# Patient Record
Sex: Female | Born: 1935 | Race: White | Hispanic: No | Marital: Married | Smoking: Never smoker
Health system: Southern US, Community
[De-identification: ages and names within clinical notes are randomized; demographics above are authoritative.]

## PROBLEM LIST (undated history)

## (undated) DIAGNOSIS — E785 Hyperlipidemia, unspecified: Secondary | ICD-10-CM

## (undated) DIAGNOSIS — I10 Essential (primary) hypertension: Secondary | ICD-10-CM

## (undated) DIAGNOSIS — I351 Nonrheumatic aortic (valve) insufficiency: Secondary | ICD-10-CM

## (undated) DIAGNOSIS — M8580 Other specified disorders of bone density and structure, unspecified site: Secondary | ICD-10-CM

## (undated) DIAGNOSIS — M170 Bilateral primary osteoarthritis of knee: Secondary | ICD-10-CM

## (undated) HISTORY — DX: Bilateral primary osteoarthritis of knee: M17.0

## (undated) HISTORY — PX: VARICOSE VEIN SURGERY: SHX832

## (undated) HISTORY — DX: Hyperlipidemia, unspecified: E78.5

## (undated) HISTORY — PX: REPAIR RECTOCELE: SUR1206

## (undated) HISTORY — PX: TONSILLECTOMY: SUR1361

## (undated) HISTORY — DX: Other specified disorders of bone density and structure, unspecified site: M85.80

## (undated) HISTORY — DX: Nonrheumatic aortic (valve) insufficiency: I35.1

## (undated) HISTORY — PX: THYROID SURGERY: SHX805

## (undated) HISTORY — PX: APPENDECTOMY: SHX54

## (undated) HISTORY — PX: SALPINGOOPHORECTOMY: SHX82

## (undated) HISTORY — DX: Essential (primary) hypertension: I10

## (undated) HISTORY — PX: ABDOMINAL HYSTERECTOMY: SHX81

---

## 2012-10-20 DIAGNOSIS — I351 Nonrheumatic aortic (valve) insufficiency: Secondary | ICD-10-CM | POA: Insufficient documentation

## 2013-12-12 ENCOUNTER — Emergency Department: Payer: Self-pay | Admitting: Emergency Medicine

## 2015-11-16 ENCOUNTER — Other Ambulatory Visit: Payer: Self-pay

## 2015-11-16 DIAGNOSIS — I10 Essential (primary) hypertension: Secondary | ICD-10-CM | POA: Insufficient documentation

## 2015-11-16 DIAGNOSIS — M8580 Other specified disorders of bone density and structure, unspecified site: Secondary | ICD-10-CM | POA: Insufficient documentation

## 2015-11-16 DIAGNOSIS — M170 Bilateral primary osteoarthritis of knee: Secondary | ICD-10-CM | POA: Insufficient documentation

## 2015-11-16 DIAGNOSIS — E785 Hyperlipidemia, unspecified: Secondary | ICD-10-CM | POA: Insufficient documentation

## 2015-11-16 DIAGNOSIS — E039 Hypothyroidism, unspecified: Secondary | ICD-10-CM | POA: Insufficient documentation

## 2015-11-17 ENCOUNTER — Encounter: Payer: Self-pay | Admitting: Internal Medicine

## 2015-11-17 ENCOUNTER — Other Ambulatory Visit: Payer: Self-pay

## 2015-11-17 ENCOUNTER — Encounter: Payer: Self-pay | Admitting: Gastroenterology

## 2015-11-17 ENCOUNTER — Ambulatory Visit (INDEPENDENT_AMBULATORY_CARE_PROVIDER_SITE_OTHER): Payer: Medicare PPO | Admitting: Gastroenterology

## 2015-11-17 VITALS — BP 157/74 | HR 79 | Temp 98.3°F | Ht 65.0 in | Wt 109.0 lb

## 2015-11-17 DIAGNOSIS — K219 Gastro-esophageal reflux disease without esophagitis: Secondary | ICD-10-CM | POA: Diagnosis not present

## 2015-11-17 MED ORDER — RANITIDINE HCL 150 MG PO TABS: 150.0000 mg | ORAL_TABLET | Freq: Two times a day (BID) | ORAL | 1 refills | Status: DC

## 2015-11-17 MED ORDER — RANITIDINE HCL 150 MG PO TABS: 150.0000 mg | ORAL_TABLET | Freq: Two times a day (BID) | ORAL | Status: AC

## 2015-11-17 NOTE — Progress Notes (Signed)
Gastroenterology Consultation  Referring Provider:     Janace Litten., MD Primary Care Physician:  Janace Litten., MD Primary Gastroenterologist:  Dr. Jonathon Bellows  Reason for Consultation:     GERD        HPI:   Gina Baker is a 82 y.o. y/o female referred for consultation & management  by Dr. Janace Litten., MD.    Reflux:  Onset : for many years , last 6 -12 worse , 2 weeks back was the worst Symptoms: Describes her symptoms of heartburn Presently still has some chest discomfort , whenever she eats or drinks it hurts.  PPI /H2 blockers or Antacid  use and timing :zantac once a day , feels better with zantac. Certain foods make it worse  Dinner time :  Usually worse symptoms after dinner between 5-6 pm  Prior EGD: never  Family history of esophageal cancer:no     No past medical history on file.  Past Surgical History:  Procedure Laterality Date  . ABDOMINAL HYSTERECTOMY    . APPENDECTOMY    . TONSILLECTOMY      Prior to Admission medications   Medication Sig Start Date End Date Taking? Authorizing Provider  estradiol (ESTRACE) 0.5 MG tablet TAKE 1 TABLET ONE TIME DAILY 11/25/14  Yes Historical Provider, MD  levothyroxine (SYNTHROID, LEVOTHROID) 112 MCG tablet TAKE 1 TABLET EVERY DAY  (NEW  DOSE) 06/28/15  Yes Historical Provider, MD  losartan (COZAAR) 100 MG tablet Take by mouth. 05/20/15 05/19/16 Yes Historical Provider, MD  psyllium (METAMUCIL SMOOTH TEXTURE) 28 % packet Take by mouth.   Yes Historical Provider, MD    No family history on file.   Social History  Substance Use Topics  . Smoking status: Never Smoker  . Smokeless tobacco: Never Used  . Alcohol use No    Allergies as of 11/17/2015 - Review Complete 11/17/2015  Allergen Reaction Noted  . Amoxicillin Other (See Comments) 06/18/2014  . Sulfa antibiotics Other (See Comments) and Diarrhea 01/22/2012    Review of Systems:    All systems reviewed and negative except where noted in  HPI.   Physical Exam:  BP (!) 157/74   Pulse 79   Temp 98.3 F (36.8 C) (Oral)   Ht 5\' 5"  (1.651 m)   Wt 109 lb (49.4 kg)   BMI 18.14 kg/m  No LMP recorded. Psych:  Alert and cooperative. Normal mood and affect. General:   Alert,  Well-developed, well-nourished, pleasant and cooperative in NAD Head:  Normocephalic and atraumatic. Eyes:  Sclera clear, no icterus.   Conjunctiva pink. Ears:  Normal auditory acuity. Nose:  No deformity, discharge, or lesions. Mouth:  No deformity or lesions,oropharynx pink & moist. Neck:  Supple; no masses or thyromegaly. Lungs:  Respirations even and unlabored.  Clear throughout to auscultation.   No wheezes, crackles, or rhonchi. No acute distress. Heart:  Regular rate and rhythm; no murmurs, clicks, rubs, or gallops. Abdomen:  Normal bowel sounds.  No bruits.  Soft, non-tender and non-distended without masses, hepatosplenomegaly or hernias noted.  No guarding or rebound tenderness.    Msk:  Symmetrical without gross deformities. Good, equal movement & strength bilaterally. Pulses:  Normal pulses noted. Extremities:  No clubbing or edema.  No cyanosis. Neurologic:  Alert and oriented x3;  grossly normal neurologically. Skin:  Intact without significant lesions or rashes. No jaundice. Lymph Nodes:  No significant cervical adenopathy. Psych:  Alert and cooperative. Normal mood and affect.  Imaging Studies: No results  found.  Assessment and Plan:   Gina Baker is a 82 y.o. y/o female has been referred for GERD. She has good relief with zantac but not complete.    1. GERD : Counseled on life style changes, . Advised on the use of a wedge pillow at night , avoid meals for 2 hours prior to bed time. Incease zantac from once a day to twice a day 150 mg . I did discuss that if she does not feel better then can consider an EGD. She is really not too keen on an EGD, We did discuss the risks vs benefits of an endoscopy   Follow up in 8-12 weeks  Dr  Jonathon Bellows MD       Follow up in 6 weeks

## 2015-11-17 NOTE — Patient Instructions (Addendum)

## 2015-11-17 NOTE — Addendum Note (Signed)
Addended by: Jonathon Bellows on: 11/17/2015 01:53 PM   Modules accepted: Orders

## 2015-11-26 IMAGING — CR DG ABDOMEN 3V
1 series · 3 of 3 positions shown · non-contrast
Comparison: None.

CLINICAL DATA: 86-year-old with constipation.

EXAM:
ABDOMEN SERIES

[Series 1: dxr abdomen 3-way (incl pa cxr) · 0.14mm/px · 3 of 3 slices shown]
[im 1/3]
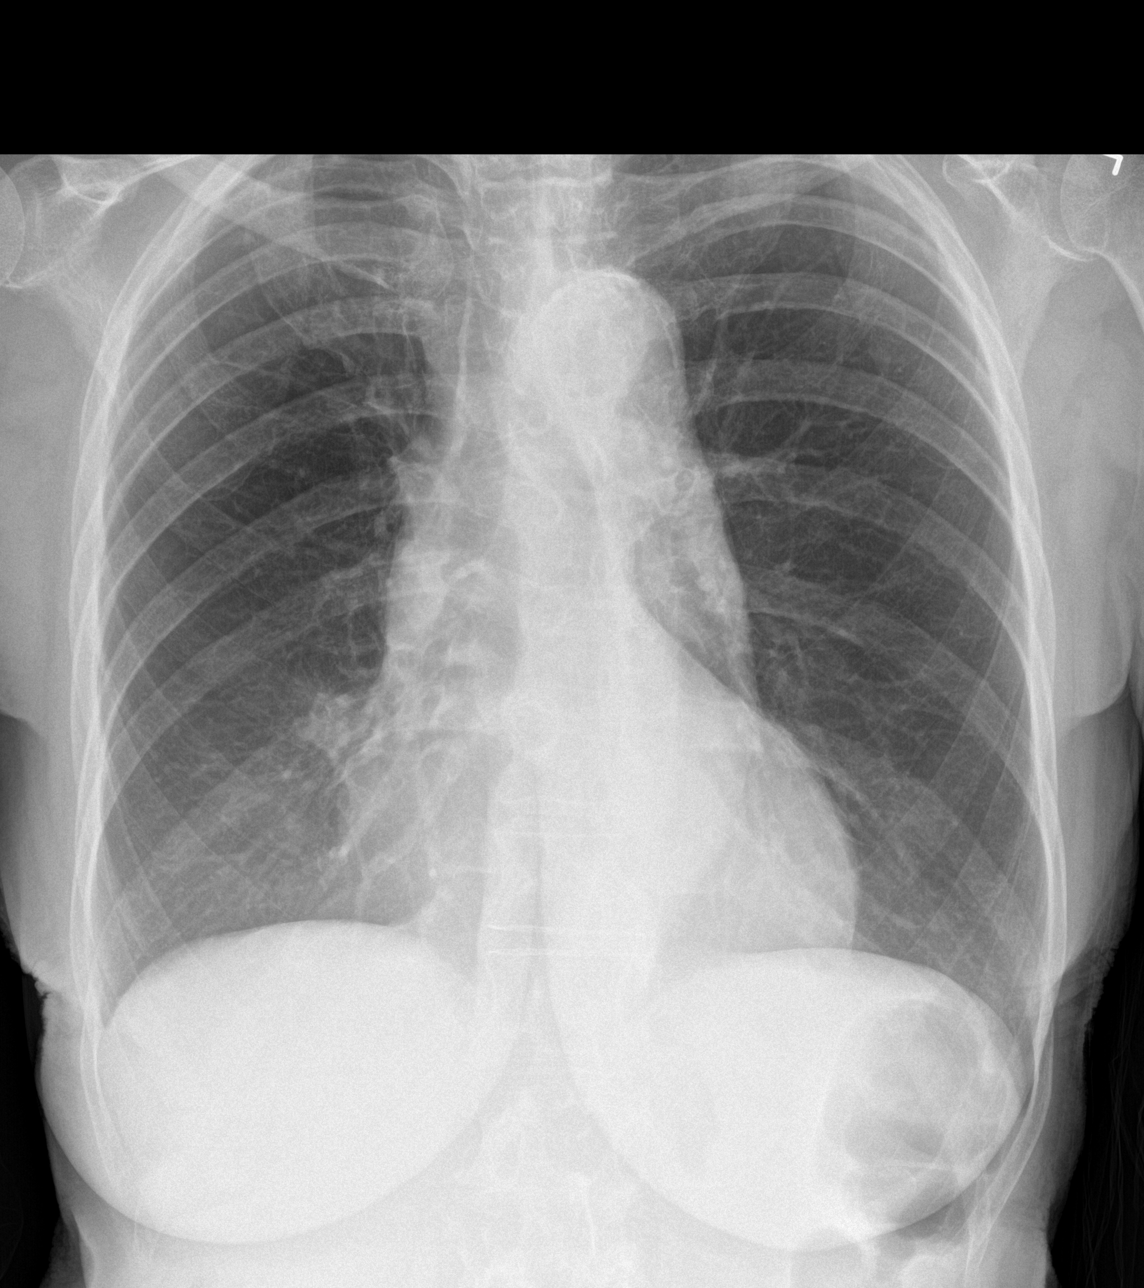
[im 2/3]
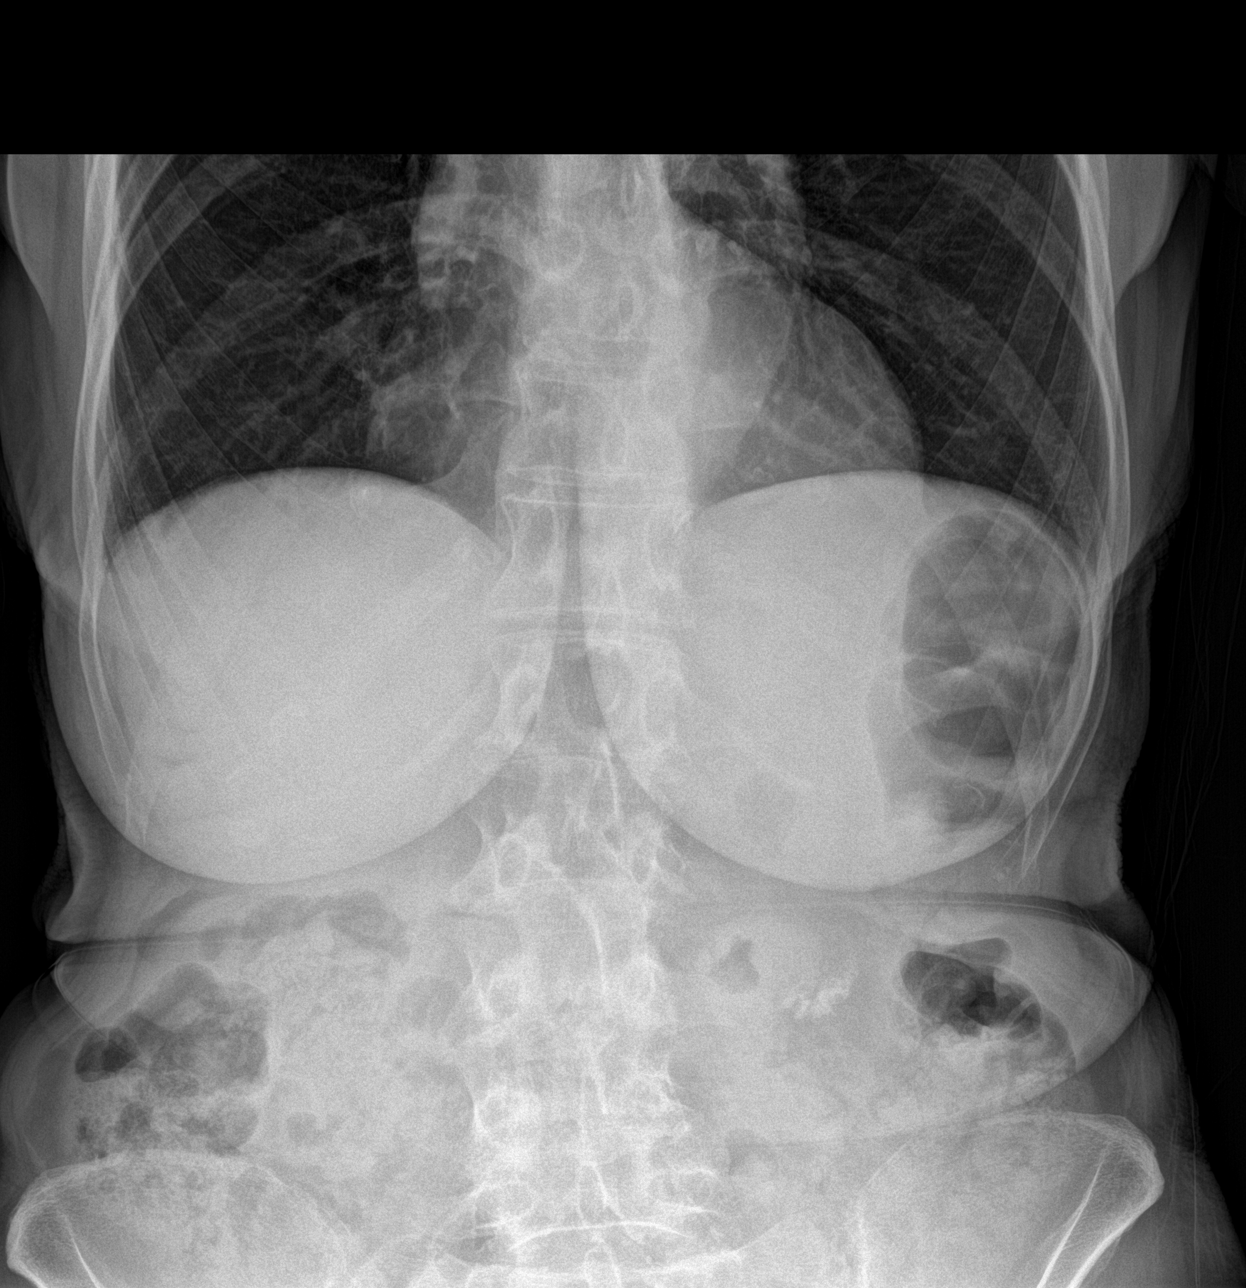
[im 3/3]
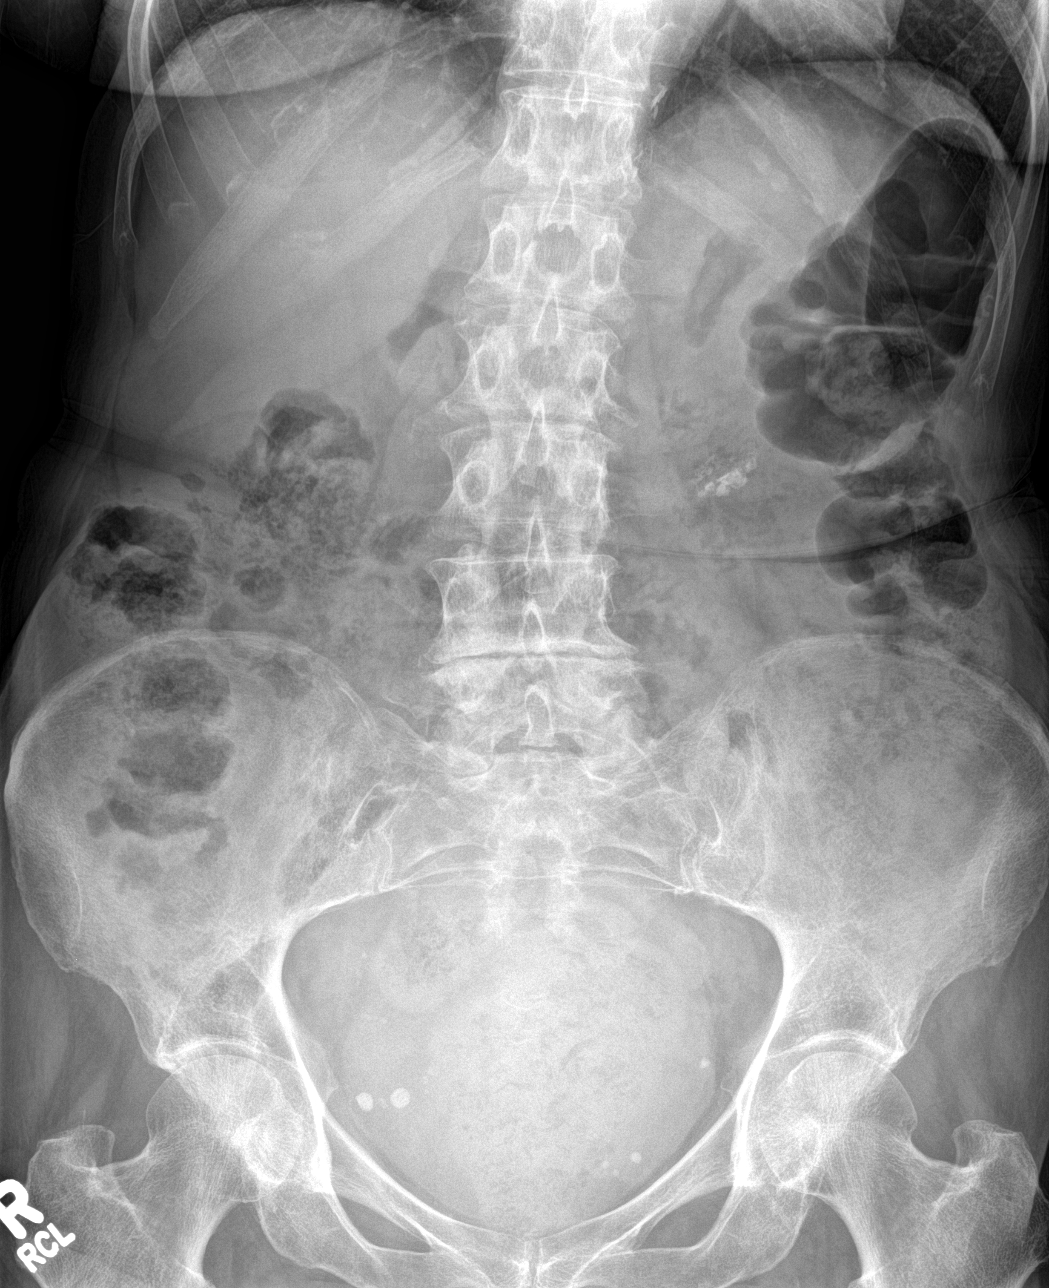

[3 of 3 positions shown; findings below may reference images not displayed]

FINDINGS: Chest radiograph demonstrates atherosclerotic calcifications in the
thoracic aorta. Heart size is normal. Possible calcifications and
scarring at the right lung apex. No focal airspace disease. No
evidence for free air. Calcifications in the expected location of
the left kidney. There is a large amount of stool throughout the
abdomen and pelvis. Large amount of stool in the rectum. Phleboliths
throughout the pelvis. Disc space narrowing with endplate changes at
L4-L5.
IMPRESSION: Large stool burden in the abdomen and pelvis. Findings are
compatible with constipation.

Calcifications or high-density material in the region of the left
kidney. Findings could represent left renal stones but this material
could also be within stool.

No acute chest findings.

## 2015-12-28 ENCOUNTER — Other Ambulatory Visit: Payer: Self-pay

## 2015-12-28 ENCOUNTER — Telehealth: Payer: Self-pay | Admitting: Gastroenterology

## 2015-12-28 MED ORDER — RANITIDINE HCL 150 MG PO TABS: 150.0000 mg | ORAL_TABLET | Freq: Two times a day (BID) | ORAL | 5 refills | Status: AC

## 2015-12-28 NOTE — Telephone Encounter (Signed)
Patient canceled her appointment on 12/20. She stated that her PCP may have her do Cologuard. She also stataed that Dr. Vicente Males was suppose to call in Zantac to [facility] in [place] but they didn't get it. Please call

## 2015-12-30 NOTE — Telephone Encounter (Signed)
Pt notified rx for Zantac has been sent to her pharmacy.

## 2016-01-05 ENCOUNTER — Ambulatory Visit: Payer: Medicare PPO | Admitting: Gastroenterology

## 2020-02-04 DIAGNOSIS — M9903 Segmental and somatic dysfunction of lumbar region: Secondary | ICD-10-CM | POA: Diagnosis not present

## 2020-02-04 DIAGNOSIS — M9901 Segmental and somatic dysfunction of cervical region: Secondary | ICD-10-CM | POA: Diagnosis not present

## 2020-02-04 DIAGNOSIS — R519 Headache, unspecified: Secondary | ICD-10-CM | POA: Diagnosis not present

## 2020-02-04 DIAGNOSIS — M4306 Spondylolysis, lumbar region: Secondary | ICD-10-CM | POA: Diagnosis not present

## 2020-03-03 DIAGNOSIS — M9903 Segmental and somatic dysfunction of lumbar region: Secondary | ICD-10-CM | POA: Diagnosis not present

## 2020-03-03 DIAGNOSIS — M9901 Segmental and somatic dysfunction of cervical region: Secondary | ICD-10-CM | POA: Diagnosis not present

## 2020-03-03 DIAGNOSIS — M4306 Spondylolysis, lumbar region: Secondary | ICD-10-CM | POA: Diagnosis not present

## 2020-03-03 DIAGNOSIS — R519 Headache, unspecified: Secondary | ICD-10-CM | POA: Diagnosis not present

## 2020-03-29 DIAGNOSIS — H40003 Preglaucoma, unspecified, bilateral: Secondary | ICD-10-CM | POA: Diagnosis not present

## 2020-03-29 DIAGNOSIS — H31012 Macula scars of posterior pole (postinflammatory) (post-traumatic), left eye: Secondary | ICD-10-CM | POA: Diagnosis not present

## 2020-03-31 DIAGNOSIS — M9903 Segmental and somatic dysfunction of lumbar region: Secondary | ICD-10-CM | POA: Diagnosis not present

## 2020-03-31 DIAGNOSIS — M4306 Spondylolysis, lumbar region: Secondary | ICD-10-CM | POA: Diagnosis not present

## 2020-03-31 DIAGNOSIS — M9901 Segmental and somatic dysfunction of cervical region: Secondary | ICD-10-CM | POA: Diagnosis not present

## 2020-03-31 DIAGNOSIS — R519 Headache, unspecified: Secondary | ICD-10-CM | POA: Diagnosis not present

## 2020-04-06 DIAGNOSIS — I10 Essential (primary) hypertension: Secondary | ICD-10-CM | POA: Diagnosis not present

## 2020-04-06 DIAGNOSIS — I2510 Atherosclerotic heart disease of native coronary artery without angina pectoris: Secondary | ICD-10-CM | POA: Diagnosis not present

## 2020-04-06 DIAGNOSIS — E039 Hypothyroidism, unspecified: Secondary | ICD-10-CM | POA: Diagnosis not present

## 2020-04-06 DIAGNOSIS — E782 Mixed hyperlipidemia: Secondary | ICD-10-CM | POA: Diagnosis not present

## 2020-04-07 DIAGNOSIS — I10 Essential (primary) hypertension: Secondary | ICD-10-CM | POA: Diagnosis not present

## 2020-04-07 DIAGNOSIS — E039 Hypothyroidism, unspecified: Secondary | ICD-10-CM | POA: Diagnosis not present

## 2020-04-07 DIAGNOSIS — K5900 Constipation, unspecified: Secondary | ICD-10-CM | POA: Diagnosis not present

## 2020-04-07 DIAGNOSIS — J309 Allergic rhinitis, unspecified: Secondary | ICD-10-CM | POA: Diagnosis not present

## 2020-04-07 DIAGNOSIS — I2510 Atherosclerotic heart disease of native coronary artery without angina pectoris: Secondary | ICD-10-CM | POA: Diagnosis not present

## 2020-04-07 DIAGNOSIS — Z008 Encounter for other general examination: Secondary | ICD-10-CM | POA: Diagnosis not present

## 2020-04-07 DIAGNOSIS — H269 Unspecified cataract: Secondary | ICD-10-CM | POA: Diagnosis not present

## 2020-04-07 DIAGNOSIS — R32 Unspecified urinary incontinence: Secondary | ICD-10-CM | POA: Diagnosis not present

## 2020-04-07 DIAGNOSIS — M1990 Unspecified osteoarthritis, unspecified site: Secondary | ICD-10-CM | POA: Diagnosis not present

## 2020-04-07 DIAGNOSIS — G8929 Other chronic pain: Secondary | ICD-10-CM | POA: Diagnosis not present

## 2020-04-07 DIAGNOSIS — K219 Gastro-esophageal reflux disease without esophagitis: Secondary | ICD-10-CM | POA: Diagnosis not present

## 2020-04-07 DIAGNOSIS — E785 Hyperlipidemia, unspecified: Secondary | ICD-10-CM | POA: Diagnosis not present

## 2020-04-07 DIAGNOSIS — Z7982 Long term (current) use of aspirin: Secondary | ICD-10-CM | POA: Diagnosis not present

## 2020-04-12 DIAGNOSIS — I10 Essential (primary) hypertension: Secondary | ICD-10-CM | POA: Diagnosis not present

## 2020-04-12 DIAGNOSIS — E039 Hypothyroidism, unspecified: Secondary | ICD-10-CM | POA: Diagnosis not present

## 2020-05-11 DIAGNOSIS — R519 Headache, unspecified: Secondary | ICD-10-CM | POA: Diagnosis not present

## 2020-05-11 DIAGNOSIS — M9901 Segmental and somatic dysfunction of cervical region: Secondary | ICD-10-CM | POA: Diagnosis not present

## 2020-05-11 DIAGNOSIS — M9903 Segmental and somatic dysfunction of lumbar region: Secondary | ICD-10-CM | POA: Diagnosis not present

## 2020-05-11 DIAGNOSIS — M4306 Spondylolysis, lumbar region: Secondary | ICD-10-CM | POA: Diagnosis not present

## 2020-05-31 DIAGNOSIS — I10 Essential (primary) hypertension: Secondary | ICD-10-CM | POA: Diagnosis not present

## 2020-05-31 DIAGNOSIS — E039 Hypothyroidism, unspecified: Secondary | ICD-10-CM | POA: Diagnosis not present

## 2020-06-08 DIAGNOSIS — R519 Headache, unspecified: Secondary | ICD-10-CM | POA: Diagnosis not present

## 2020-06-08 DIAGNOSIS — M9903 Segmental and somatic dysfunction of lumbar region: Secondary | ICD-10-CM | POA: Diagnosis not present

## 2020-06-08 DIAGNOSIS — M4306 Spondylolysis, lumbar region: Secondary | ICD-10-CM | POA: Diagnosis not present

## 2020-06-08 DIAGNOSIS — M9901 Segmental and somatic dysfunction of cervical region: Secondary | ICD-10-CM | POA: Diagnosis not present

## 2020-07-06 DIAGNOSIS — M4306 Spondylolysis, lumbar region: Secondary | ICD-10-CM | POA: Diagnosis not present

## 2020-07-06 DIAGNOSIS — M9903 Segmental and somatic dysfunction of lumbar region: Secondary | ICD-10-CM | POA: Diagnosis not present

## 2020-07-06 DIAGNOSIS — M9901 Segmental and somatic dysfunction of cervical region: Secondary | ICD-10-CM | POA: Diagnosis not present

## 2020-07-06 DIAGNOSIS — R519 Headache, unspecified: Secondary | ICD-10-CM | POA: Diagnosis not present

## 2020-07-30 DIAGNOSIS — I10 Essential (primary) hypertension: Secondary | ICD-10-CM | POA: Diagnosis not present

## 2020-07-30 DIAGNOSIS — E782 Mixed hyperlipidemia: Secondary | ICD-10-CM | POA: Diagnosis not present

## 2020-07-30 DIAGNOSIS — E039 Hypothyroidism, unspecified: Secondary | ICD-10-CM | POA: Diagnosis not present

## 2020-08-03 DIAGNOSIS — M4306 Spondylolysis, lumbar region: Secondary | ICD-10-CM | POA: Diagnosis not present

## 2020-08-03 DIAGNOSIS — R519 Headache, unspecified: Secondary | ICD-10-CM | POA: Diagnosis not present

## 2020-08-03 DIAGNOSIS — M9903 Segmental and somatic dysfunction of lumbar region: Secondary | ICD-10-CM | POA: Diagnosis not present

## 2020-08-03 DIAGNOSIS — M9901 Segmental and somatic dysfunction of cervical region: Secondary | ICD-10-CM | POA: Diagnosis not present

## 2020-08-13 DIAGNOSIS — E039 Hypothyroidism, unspecified: Secondary | ICD-10-CM | POA: Diagnosis not present

## 2020-08-13 DIAGNOSIS — J3089 Other allergic rhinitis: Secondary | ICD-10-CM | POA: Diagnosis not present

## 2020-08-13 DIAGNOSIS — I10 Essential (primary) hypertension: Secondary | ICD-10-CM | POA: Diagnosis not present

## 2020-08-13 DIAGNOSIS — E782 Mixed hyperlipidemia: Secondary | ICD-10-CM | POA: Diagnosis not present

## 2020-08-31 DIAGNOSIS — R519 Headache, unspecified: Secondary | ICD-10-CM | POA: Diagnosis not present

## 2020-08-31 DIAGNOSIS — M9903 Segmental and somatic dysfunction of lumbar region: Secondary | ICD-10-CM | POA: Diagnosis not present

## 2020-08-31 DIAGNOSIS — M9901 Segmental and somatic dysfunction of cervical region: Secondary | ICD-10-CM | POA: Diagnosis not present

## 2020-08-31 DIAGNOSIS — M4306 Spondylolysis, lumbar region: Secondary | ICD-10-CM | POA: Diagnosis not present

## 2020-09-28 DIAGNOSIS — R519 Headache, unspecified: Secondary | ICD-10-CM | POA: Diagnosis not present

## 2020-09-28 DIAGNOSIS — M9903 Segmental and somatic dysfunction of lumbar region: Secondary | ICD-10-CM | POA: Diagnosis not present

## 2020-09-28 DIAGNOSIS — M9901 Segmental and somatic dysfunction of cervical region: Secondary | ICD-10-CM | POA: Diagnosis not present

## 2020-09-28 DIAGNOSIS — M4306 Spondylolysis, lumbar region: Secondary | ICD-10-CM | POA: Diagnosis not present

## 2020-10-18 DIAGNOSIS — Z23 Encounter for immunization: Secondary | ICD-10-CM | POA: Diagnosis not present

## 2020-10-19 DIAGNOSIS — T63421A Toxic effect of venom of ants, accidental (unintentional), initial encounter: Secondary | ICD-10-CM | POA: Diagnosis not present

## 2020-10-19 DIAGNOSIS — L509 Urticaria, unspecified: Secondary | ICD-10-CM | POA: Diagnosis not present

## 2020-10-28 DIAGNOSIS — I10 Essential (primary) hypertension: Secondary | ICD-10-CM | POA: Diagnosis not present

## 2020-10-28 DIAGNOSIS — E782 Mixed hyperlipidemia: Secondary | ICD-10-CM | POA: Diagnosis not present

## 2020-10-28 DIAGNOSIS — I2510 Atherosclerotic heart disease of native coronary artery without angina pectoris: Secondary | ICD-10-CM | POA: Diagnosis not present

## 2020-10-28 DIAGNOSIS — T63481D Toxic effect of venom of other arthropod, accidental (unintentional), subsequent encounter: Secondary | ICD-10-CM | POA: Diagnosis not present

## 2020-11-02 DIAGNOSIS — M4306 Spondylolysis, lumbar region: Secondary | ICD-10-CM | POA: Diagnosis not present

## 2020-11-02 DIAGNOSIS — M9903 Segmental and somatic dysfunction of lumbar region: Secondary | ICD-10-CM | POA: Diagnosis not present

## 2020-11-02 DIAGNOSIS — M9901 Segmental and somatic dysfunction of cervical region: Secondary | ICD-10-CM | POA: Diagnosis not present

## 2020-11-02 DIAGNOSIS — R519 Headache, unspecified: Secondary | ICD-10-CM | POA: Diagnosis not present

## 2020-11-18 DIAGNOSIS — I10 Essential (primary) hypertension: Secondary | ICD-10-CM | POA: Diagnosis not present

## 2020-11-18 DIAGNOSIS — E039 Hypothyroidism, unspecified: Secondary | ICD-10-CM | POA: Diagnosis not present

## 2020-11-18 DIAGNOSIS — E782 Mixed hyperlipidemia: Secondary | ICD-10-CM | POA: Diagnosis not present

## 2020-11-30 DIAGNOSIS — M4306 Spondylolysis, lumbar region: Secondary | ICD-10-CM | POA: Diagnosis not present

## 2020-11-30 DIAGNOSIS — R519 Headache, unspecified: Secondary | ICD-10-CM | POA: Diagnosis not present

## 2020-11-30 DIAGNOSIS — M9901 Segmental and somatic dysfunction of cervical region: Secondary | ICD-10-CM | POA: Diagnosis not present

## 2020-11-30 DIAGNOSIS — M9903 Segmental and somatic dysfunction of lumbar region: Secondary | ICD-10-CM | POA: Diagnosis not present

## 2020-12-07 DIAGNOSIS — Z85828 Personal history of other malignant neoplasm of skin: Secondary | ICD-10-CM | POA: Diagnosis not present

## 2020-12-07 DIAGNOSIS — X32XXXA Exposure to sunlight, initial encounter: Secondary | ICD-10-CM | POA: Diagnosis not present

## 2020-12-07 DIAGNOSIS — L821 Other seborrheic keratosis: Secondary | ICD-10-CM | POA: Diagnosis not present

## 2020-12-07 DIAGNOSIS — D2271 Melanocytic nevi of right lower limb, including hip: Secondary | ICD-10-CM | POA: Diagnosis not present

## 2020-12-07 DIAGNOSIS — D225 Melanocytic nevi of trunk: Secondary | ICD-10-CM | POA: Diagnosis not present

## 2020-12-07 DIAGNOSIS — L570 Actinic keratosis: Secondary | ICD-10-CM | POA: Diagnosis not present

## 2020-12-07 DIAGNOSIS — D2261 Melanocytic nevi of right upper limb, including shoulder: Secondary | ICD-10-CM | POA: Diagnosis not present

## 2020-12-07 DIAGNOSIS — D2262 Melanocytic nevi of left upper limb, including shoulder: Secondary | ICD-10-CM | POA: Diagnosis not present

## 2020-12-08 DIAGNOSIS — I10 Essential (primary) hypertension: Secondary | ICD-10-CM | POA: Diagnosis not present

## 2020-12-08 DIAGNOSIS — E039 Hypothyroidism, unspecified: Secondary | ICD-10-CM | POA: Diagnosis not present

## 2020-12-08 DIAGNOSIS — E782 Mixed hyperlipidemia: Secondary | ICD-10-CM | POA: Diagnosis not present

## 2020-12-14 DIAGNOSIS — J301 Allergic rhinitis due to pollen: Secondary | ICD-10-CM | POA: Diagnosis not present

## 2020-12-14 DIAGNOSIS — E039 Hypothyroidism, unspecified: Secondary | ICD-10-CM | POA: Diagnosis not present

## 2020-12-14 DIAGNOSIS — E782 Mixed hyperlipidemia: Secondary | ICD-10-CM | POA: Diagnosis not present

## 2020-12-14 DIAGNOSIS — K2100 Gastro-esophageal reflux disease with esophagitis, without bleeding: Secondary | ICD-10-CM | POA: Diagnosis not present

## 2020-12-14 DIAGNOSIS — I10 Essential (primary) hypertension: Secondary | ICD-10-CM | POA: Diagnosis not present

## 2020-12-28 DIAGNOSIS — M4306 Spondylolysis, lumbar region: Secondary | ICD-10-CM | POA: Diagnosis not present

## 2020-12-28 DIAGNOSIS — R519 Headache, unspecified: Secondary | ICD-10-CM | POA: Diagnosis not present

## 2020-12-28 DIAGNOSIS — M9901 Segmental and somatic dysfunction of cervical region: Secondary | ICD-10-CM | POA: Diagnosis not present

## 2020-12-28 DIAGNOSIS — M9903 Segmental and somatic dysfunction of lumbar region: Secondary | ICD-10-CM | POA: Diagnosis not present

## 2021-01-03 DIAGNOSIS — H31012 Macula scars of posterior pole (postinflammatory) (post-traumatic), left eye: Secondary | ICD-10-CM | POA: Diagnosis not present

## 2021-01-03 DIAGNOSIS — H524 Presbyopia: Secondary | ICD-10-CM | POA: Diagnosis not present

## 2021-01-03 DIAGNOSIS — D3131 Benign neoplasm of right choroid: Secondary | ICD-10-CM | POA: Diagnosis not present

## 2021-01-03 DIAGNOSIS — H2512 Age-related nuclear cataract, left eye: Secondary | ICD-10-CM | POA: Diagnosis not present

## 2021-03-15 DIAGNOSIS — E782 Mixed hyperlipidemia: Secondary | ICD-10-CM | POA: Diagnosis not present

## 2021-03-15 DIAGNOSIS — I10 Essential (primary) hypertension: Secondary | ICD-10-CM | POA: Diagnosis not present

## 2021-03-15 DIAGNOSIS — I340 Nonrheumatic mitral (valve) insufficiency: Secondary | ICD-10-CM | POA: Diagnosis not present

## 2021-03-15 DIAGNOSIS — I351 Nonrheumatic aortic (valve) insufficiency: Secondary | ICD-10-CM | POA: Diagnosis not present

## 2021-03-15 DIAGNOSIS — R079 Chest pain, unspecified: Secondary | ICD-10-CM | POA: Diagnosis not present

## 2021-03-15 DIAGNOSIS — I2510 Atherosclerotic heart disease of native coronary artery without angina pectoris: Secondary | ICD-10-CM | POA: Diagnosis not present

## 2021-03-28 DIAGNOSIS — E782 Mixed hyperlipidemia: Secondary | ICD-10-CM | POA: Diagnosis not present

## 2021-03-28 DIAGNOSIS — R69 Illness, unspecified: Secondary | ICD-10-CM | POA: Diagnosis not present

## 2021-03-28 DIAGNOSIS — E039 Hypothyroidism, unspecified: Secondary | ICD-10-CM | POA: Diagnosis not present

## 2021-03-28 DIAGNOSIS — I10 Essential (primary) hypertension: Secondary | ICD-10-CM | POA: Diagnosis not present

## 2021-04-06 DIAGNOSIS — R079 Chest pain, unspecified: Secondary | ICD-10-CM | POA: Diagnosis not present

## 2021-04-12 DIAGNOSIS — I2510 Atherosclerotic heart disease of native coronary artery without angina pectoris: Secondary | ICD-10-CM | POA: Diagnosis not present

## 2021-04-12 DIAGNOSIS — I340 Nonrheumatic mitral (valve) insufficiency: Secondary | ICD-10-CM | POA: Diagnosis not present

## 2021-04-12 DIAGNOSIS — E782 Mixed hyperlipidemia: Secondary | ICD-10-CM | POA: Diagnosis not present

## 2021-04-12 DIAGNOSIS — I351 Nonrheumatic aortic (valve) insufficiency: Secondary | ICD-10-CM | POA: Diagnosis not present

## 2021-04-12 DIAGNOSIS — I10 Essential (primary) hypertension: Secondary | ICD-10-CM | POA: Diagnosis not present

## 2021-04-12 DIAGNOSIS — R079 Chest pain, unspecified: Secondary | ICD-10-CM | POA: Diagnosis not present

## 2021-04-19 DIAGNOSIS — E039 Hypothyroidism, unspecified: Secondary | ICD-10-CM | POA: Diagnosis not present

## 2021-04-19 DIAGNOSIS — I10 Essential (primary) hypertension: Secondary | ICD-10-CM | POA: Diagnosis not present

## 2021-04-19 DIAGNOSIS — E782 Mixed hyperlipidemia: Secondary | ICD-10-CM | POA: Diagnosis not present

## 2021-04-19 DIAGNOSIS — R69 Illness, unspecified: Secondary | ICD-10-CM | POA: Diagnosis not present

## 2021-04-19 DIAGNOSIS — I2510 Atherosclerotic heart disease of native coronary artery without angina pectoris: Secondary | ICD-10-CM | POA: Diagnosis not present

## 2021-04-28 DIAGNOSIS — I2510 Atherosclerotic heart disease of native coronary artery without angina pectoris: Secondary | ICD-10-CM | POA: Diagnosis not present

## 2021-05-05 DIAGNOSIS — E782 Mixed hyperlipidemia: Secondary | ICD-10-CM | POA: Diagnosis not present

## 2021-05-05 DIAGNOSIS — I2510 Atherosclerotic heart disease of native coronary artery without angina pectoris: Secondary | ICD-10-CM | POA: Diagnosis not present

## 2021-05-05 DIAGNOSIS — I340 Nonrheumatic mitral (valve) insufficiency: Secondary | ICD-10-CM | POA: Diagnosis not present

## 2021-05-05 DIAGNOSIS — I10 Essential (primary) hypertension: Secondary | ICD-10-CM | POA: Diagnosis not present

## 2021-05-05 DIAGNOSIS — I351 Nonrheumatic aortic (valve) insufficiency: Secondary | ICD-10-CM | POA: Diagnosis not present

## 2021-05-05 DIAGNOSIS — R0602 Shortness of breath: Secondary | ICD-10-CM | POA: Diagnosis not present

## 2021-05-19 DIAGNOSIS — I351 Nonrheumatic aortic (valve) insufficiency: Secondary | ICD-10-CM | POA: Diagnosis not present

## 2021-05-19 DIAGNOSIS — I10 Essential (primary) hypertension: Secondary | ICD-10-CM | POA: Diagnosis not present

## 2021-05-19 DIAGNOSIS — E782 Mixed hyperlipidemia: Secondary | ICD-10-CM | POA: Diagnosis not present

## 2021-05-19 DIAGNOSIS — I2510 Atherosclerotic heart disease of native coronary artery without angina pectoris: Secondary | ICD-10-CM | POA: Diagnosis not present

## 2021-05-19 DIAGNOSIS — I340 Nonrheumatic mitral (valve) insufficiency: Secondary | ICD-10-CM | POA: Diagnosis not present

## 2021-06-03 DIAGNOSIS — I340 Nonrheumatic mitral (valve) insufficiency: Secondary | ICD-10-CM | POA: Diagnosis not present

## 2021-06-03 DIAGNOSIS — I10 Essential (primary) hypertension: Secondary | ICD-10-CM | POA: Diagnosis not present

## 2021-06-03 DIAGNOSIS — E782 Mixed hyperlipidemia: Secondary | ICD-10-CM | POA: Diagnosis not present

## 2021-06-03 DIAGNOSIS — I2510 Atherosclerotic heart disease of native coronary artery without angina pectoris: Secondary | ICD-10-CM | POA: Diagnosis not present

## 2021-06-03 DIAGNOSIS — I351 Nonrheumatic aortic (valve) insufficiency: Secondary | ICD-10-CM | POA: Diagnosis not present

## 2021-06-09 DIAGNOSIS — Z681 Body mass index (BMI) 19 or less, adult: Secondary | ICD-10-CM | POA: Diagnosis not present

## 2021-06-09 DIAGNOSIS — R636 Underweight: Secondary | ICD-10-CM | POA: Diagnosis not present

## 2021-06-09 DIAGNOSIS — K219 Gastro-esophageal reflux disease without esophagitis: Secondary | ICD-10-CM | POA: Diagnosis not present

## 2021-06-09 DIAGNOSIS — I2510 Atherosclerotic heart disease of native coronary artery without angina pectoris: Secondary | ICD-10-CM | POA: Diagnosis not present

## 2021-06-09 DIAGNOSIS — M1990 Unspecified osteoarthritis, unspecified site: Secondary | ICD-10-CM | POA: Diagnosis not present

## 2021-06-09 DIAGNOSIS — F411 Generalized anxiety disorder: Secondary | ICD-10-CM | POA: Diagnosis not present

## 2021-06-09 DIAGNOSIS — Z008 Encounter for other general examination: Secondary | ICD-10-CM | POA: Diagnosis not present

## 2021-06-09 DIAGNOSIS — E890 Postprocedural hypothyroidism: Secondary | ICD-10-CM | POA: Diagnosis not present

## 2021-06-09 DIAGNOSIS — Z8249 Family history of ischemic heart disease and other diseases of the circulatory system: Secondary | ICD-10-CM | POA: Diagnosis not present

## 2021-06-09 DIAGNOSIS — I10 Essential (primary) hypertension: Secondary | ICD-10-CM | POA: Diagnosis not present

## 2021-06-09 DIAGNOSIS — R69 Illness, unspecified: Secondary | ICD-10-CM | POA: Diagnosis not present

## 2021-06-09 DIAGNOSIS — Z809 Family history of malignant neoplasm, unspecified: Secondary | ICD-10-CM | POA: Diagnosis not present

## 2021-06-17 DIAGNOSIS — E782 Mixed hyperlipidemia: Secondary | ICD-10-CM | POA: Diagnosis not present

## 2021-06-17 DIAGNOSIS — I739 Peripheral vascular disease, unspecified: Secondary | ICD-10-CM | POA: Diagnosis not present

## 2021-06-17 DIAGNOSIS — I351 Nonrheumatic aortic (valve) insufficiency: Secondary | ICD-10-CM | POA: Diagnosis not present

## 2021-06-17 DIAGNOSIS — I2510 Atherosclerotic heart disease of native coronary artery without angina pectoris: Secondary | ICD-10-CM | POA: Diagnosis not present

## 2021-06-17 DIAGNOSIS — I340 Nonrheumatic mitral (valve) insufficiency: Secondary | ICD-10-CM | POA: Diagnosis not present

## 2021-06-17 DIAGNOSIS — I10 Essential (primary) hypertension: Secondary | ICD-10-CM | POA: Diagnosis not present

## 2021-06-27 DIAGNOSIS — I739 Peripheral vascular disease, unspecified: Secondary | ICD-10-CM | POA: Diagnosis not present

## 2021-07-08 DIAGNOSIS — I739 Peripheral vascular disease, unspecified: Secondary | ICD-10-CM | POA: Diagnosis not present

## 2021-07-08 DIAGNOSIS — E782 Mixed hyperlipidemia: Secondary | ICD-10-CM | POA: Diagnosis not present

## 2021-07-08 DIAGNOSIS — I10 Essential (primary) hypertension: Secondary | ICD-10-CM | POA: Diagnosis not present

## 2021-07-08 DIAGNOSIS — I340 Nonrheumatic mitral (valve) insufficiency: Secondary | ICD-10-CM | POA: Diagnosis not present

## 2021-07-08 DIAGNOSIS — I2510 Atherosclerotic heart disease of native coronary artery without angina pectoris: Secondary | ICD-10-CM | POA: Diagnosis not present

## 2021-07-08 DIAGNOSIS — I351 Nonrheumatic aortic (valve) insufficiency: Secondary | ICD-10-CM | POA: Diagnosis not present

## 2021-08-10 DIAGNOSIS — E039 Hypothyroidism, unspecified: Secondary | ICD-10-CM | POA: Diagnosis not present

## 2021-08-10 DIAGNOSIS — E782 Mixed hyperlipidemia: Secondary | ICD-10-CM | POA: Diagnosis not present

## 2021-08-10 DIAGNOSIS — I10 Essential (primary) hypertension: Secondary | ICD-10-CM | POA: Diagnosis not present

## 2021-08-16 DIAGNOSIS — M4306 Spondylolysis, lumbar region: Secondary | ICD-10-CM | POA: Diagnosis not present

## 2021-08-16 DIAGNOSIS — R519 Headache, unspecified: Secondary | ICD-10-CM | POA: Diagnosis not present

## 2021-08-16 DIAGNOSIS — M9903 Segmental and somatic dysfunction of lumbar region: Secondary | ICD-10-CM | POA: Diagnosis not present

## 2021-08-16 DIAGNOSIS — M9901 Segmental and somatic dysfunction of cervical region: Secondary | ICD-10-CM | POA: Diagnosis not present

## 2021-08-19 DIAGNOSIS — I2510 Atherosclerotic heart disease of native coronary artery without angina pectoris: Secondary | ICD-10-CM | POA: Diagnosis not present

## 2021-08-19 DIAGNOSIS — E039 Hypothyroidism, unspecified: Secondary | ICD-10-CM | POA: Diagnosis not present

## 2021-08-19 DIAGNOSIS — R69 Illness, unspecified: Secondary | ICD-10-CM | POA: Diagnosis not present

## 2021-08-19 DIAGNOSIS — R350 Frequency of micturition: Secondary | ICD-10-CM | POA: Diagnosis not present

## 2021-08-19 DIAGNOSIS — E782 Mixed hyperlipidemia: Secondary | ICD-10-CM | POA: Diagnosis not present

## 2021-08-19 DIAGNOSIS — I10 Essential (primary) hypertension: Secondary | ICD-10-CM | POA: Diagnosis not present

## 2021-09-12 DIAGNOSIS — I10 Essential (primary) hypertension: Secondary | ICD-10-CM | POA: Diagnosis not present

## 2021-09-12 DIAGNOSIS — I2510 Atherosclerotic heart disease of native coronary artery without angina pectoris: Secondary | ICD-10-CM | POA: Diagnosis not present

## 2021-09-12 DIAGNOSIS — I739 Peripheral vascular disease, unspecified: Secondary | ICD-10-CM | POA: Diagnosis not present

## 2021-09-12 DIAGNOSIS — I351 Nonrheumatic aortic (valve) insufficiency: Secondary | ICD-10-CM | POA: Diagnosis not present

## 2021-09-12 DIAGNOSIS — E782 Mixed hyperlipidemia: Secondary | ICD-10-CM | POA: Diagnosis not present

## 2021-09-12 DIAGNOSIS — I340 Nonrheumatic mitral (valve) insufficiency: Secondary | ICD-10-CM | POA: Diagnosis not present

## 2021-09-13 DIAGNOSIS — R519 Headache, unspecified: Secondary | ICD-10-CM | POA: Diagnosis not present

## 2021-09-13 DIAGNOSIS — M9903 Segmental and somatic dysfunction of lumbar region: Secondary | ICD-10-CM | POA: Diagnosis not present

## 2021-09-13 DIAGNOSIS — M9901 Segmental and somatic dysfunction of cervical region: Secondary | ICD-10-CM | POA: Diagnosis not present

## 2021-09-13 DIAGNOSIS — M4306 Spondylolysis, lumbar region: Secondary | ICD-10-CM | POA: Diagnosis not present

## 2021-09-21 DIAGNOSIS — E782 Mixed hyperlipidemia: Secondary | ICD-10-CM | POA: Diagnosis not present

## 2021-10-11 DIAGNOSIS — M9903 Segmental and somatic dysfunction of lumbar region: Secondary | ICD-10-CM | POA: Diagnosis not present

## 2021-10-11 DIAGNOSIS — R519 Headache, unspecified: Secondary | ICD-10-CM | POA: Diagnosis not present

## 2021-10-11 DIAGNOSIS — M4306 Spondylolysis, lumbar region: Secondary | ICD-10-CM | POA: Diagnosis not present

## 2021-10-11 DIAGNOSIS — M9901 Segmental and somatic dysfunction of cervical region: Secondary | ICD-10-CM | POA: Diagnosis not present

## 2021-11-09 DIAGNOSIS — M9901 Segmental and somatic dysfunction of cervical region: Secondary | ICD-10-CM | POA: Diagnosis not present

## 2021-11-09 DIAGNOSIS — M4306 Spondylolysis, lumbar region: Secondary | ICD-10-CM | POA: Diagnosis not present

## 2021-11-09 DIAGNOSIS — R519 Headache, unspecified: Secondary | ICD-10-CM | POA: Diagnosis not present

## 2021-11-09 DIAGNOSIS — M9903 Segmental and somatic dysfunction of lumbar region: Secondary | ICD-10-CM | POA: Diagnosis not present

## 2021-11-14 DIAGNOSIS — I340 Nonrheumatic mitral (valve) insufficiency: Secondary | ICD-10-CM | POA: Diagnosis not present

## 2021-11-14 DIAGNOSIS — I739 Peripheral vascular disease, unspecified: Secondary | ICD-10-CM | POA: Diagnosis not present

## 2021-11-14 DIAGNOSIS — E782 Mixed hyperlipidemia: Secondary | ICD-10-CM | POA: Diagnosis not present

## 2021-11-14 DIAGNOSIS — I2510 Atherosclerotic heart disease of native coronary artery without angina pectoris: Secondary | ICD-10-CM | POA: Diagnosis not present

## 2021-11-14 DIAGNOSIS — I351 Nonrheumatic aortic (valve) insufficiency: Secondary | ICD-10-CM | POA: Diagnosis not present

## 2021-11-14 DIAGNOSIS — I10 Essential (primary) hypertension: Secondary | ICD-10-CM | POA: Diagnosis not present

## 2021-11-21 DIAGNOSIS — L570 Actinic keratosis: Secondary | ICD-10-CM | POA: Diagnosis not present

## 2021-11-21 DIAGNOSIS — Z85828 Personal history of other malignant neoplasm of skin: Secondary | ICD-10-CM | POA: Diagnosis not present

## 2021-11-21 DIAGNOSIS — D2272 Melanocytic nevi of left lower limb, including hip: Secondary | ICD-10-CM | POA: Diagnosis not present

## 2021-11-21 DIAGNOSIS — D2261 Melanocytic nevi of right upper limb, including shoulder: Secondary | ICD-10-CM | POA: Diagnosis not present

## 2021-11-21 DIAGNOSIS — D2262 Melanocytic nevi of left upper limb, including shoulder: Secondary | ICD-10-CM | POA: Diagnosis not present

## 2021-12-19 DIAGNOSIS — E039 Hypothyroidism, unspecified: Secondary | ICD-10-CM | POA: Diagnosis not present

## 2021-12-19 DIAGNOSIS — I2510 Atherosclerotic heart disease of native coronary artery without angina pectoris: Secondary | ICD-10-CM | POA: Diagnosis not present

## 2021-12-19 DIAGNOSIS — E782 Mixed hyperlipidemia: Secondary | ICD-10-CM | POA: Diagnosis not present

## 2021-12-19 DIAGNOSIS — I10 Essential (primary) hypertension: Secondary | ICD-10-CM | POA: Diagnosis not present

## 2022-01-13 DIAGNOSIS — E782 Mixed hyperlipidemia: Secondary | ICD-10-CM | POA: Diagnosis not present

## 2022-01-13 DIAGNOSIS — I10 Essential (primary) hypertension: Secondary | ICD-10-CM | POA: Diagnosis not present

## 2022-01-13 DIAGNOSIS — I739 Peripheral vascular disease, unspecified: Secondary | ICD-10-CM | POA: Diagnosis not present

## 2022-01-13 DIAGNOSIS — I351 Nonrheumatic aortic (valve) insufficiency: Secondary | ICD-10-CM | POA: Diagnosis not present

## 2022-01-13 DIAGNOSIS — I2510 Atherosclerotic heart disease of native coronary artery without angina pectoris: Secondary | ICD-10-CM | POA: Diagnosis not present

## 2022-01-13 DIAGNOSIS — I340 Nonrheumatic mitral (valve) insufficiency: Secondary | ICD-10-CM | POA: Diagnosis not present

## 2022-02-22 DIAGNOSIS — S8001XA Contusion of right knee, initial encounter: Secondary | ICD-10-CM | POA: Diagnosis not present

## 2022-02-22 DIAGNOSIS — E079 Disorder of thyroid, unspecified: Secondary | ICD-10-CM | POA: Diagnosis not present

## 2022-02-22 DIAGNOSIS — R220 Localized swelling, mass and lump, head: Secondary | ICD-10-CM | POA: Diagnosis not present

## 2022-02-22 DIAGNOSIS — S51011A Laceration without foreign body of right elbow, initial encounter: Secondary | ICD-10-CM | POA: Diagnosis not present

## 2022-02-22 DIAGNOSIS — S0083XA Contusion of other part of head, initial encounter: Secondary | ICD-10-CM | POA: Diagnosis not present

## 2022-02-22 DIAGNOSIS — S0993XA Unspecified injury of face, initial encounter: Secondary | ICD-10-CM | POA: Diagnosis not present

## 2022-02-22 DIAGNOSIS — S0003XA Contusion of scalp, initial encounter: Secondary | ICD-10-CM | POA: Diagnosis not present

## 2022-02-22 DIAGNOSIS — S00212A Abrasion of left eyelid and periocular area, initial encounter: Secondary | ICD-10-CM | POA: Diagnosis not present

## 2022-02-22 DIAGNOSIS — S59901A Unspecified injury of right elbow, initial encounter: Secondary | ICD-10-CM | POA: Diagnosis not present

## 2022-02-22 DIAGNOSIS — Z23 Encounter for immunization: Secondary | ICD-10-CM | POA: Diagnosis not present

## 2022-02-22 DIAGNOSIS — S8991XA Unspecified injury of right lower leg, initial encounter: Secondary | ICD-10-CM | POA: Diagnosis not present

## 2022-02-22 DIAGNOSIS — W010XXA Fall on same level from slipping, tripping and stumbling without subsequent striking against object, initial encounter: Secondary | ICD-10-CM | POA: Diagnosis not present

## 2022-02-22 DIAGNOSIS — S199XXA Unspecified injury of neck, initial encounter: Secondary | ICD-10-CM | POA: Diagnosis not present

## 2022-02-22 DIAGNOSIS — I10 Essential (primary) hypertension: Secondary | ICD-10-CM | POA: Diagnosis not present

## 2022-02-22 DIAGNOSIS — M11261 Other chondrocalcinosis, right knee: Secondary | ICD-10-CM | POA: Diagnosis not present

## 2022-02-22 DIAGNOSIS — S299XXA Unspecified injury of thorax, initial encounter: Secondary | ICD-10-CM | POA: Diagnosis not present

## 2022-02-22 DIAGNOSIS — R519 Headache, unspecified: Secondary | ICD-10-CM | POA: Diagnosis not present

## 2022-03-20 DIAGNOSIS — M25552 Pain in left hip: Secondary | ICD-10-CM | POA: Diagnosis not present

## 2022-03-20 DIAGNOSIS — E782 Mixed hyperlipidemia: Secondary | ICD-10-CM | POA: Diagnosis not present

## 2022-03-20 DIAGNOSIS — Z131 Encounter for screening for diabetes mellitus: Secondary | ICD-10-CM | POA: Diagnosis not present

## 2022-03-20 DIAGNOSIS — I25118 Atherosclerotic heart disease of native coronary artery with other forms of angina pectoris: Secondary | ICD-10-CM | POA: Diagnosis not present

## 2022-03-20 DIAGNOSIS — L03031 Cellulitis of right toe: Secondary | ICD-10-CM | POA: Diagnosis not present

## 2022-03-20 DIAGNOSIS — E039 Hypothyroidism, unspecified: Secondary | ICD-10-CM | POA: Diagnosis not present

## 2022-03-20 DIAGNOSIS — I739 Peripheral vascular disease, unspecified: Secondary | ICD-10-CM | POA: Diagnosis not present

## 2022-03-20 DIAGNOSIS — I10 Essential (primary) hypertension: Secondary | ICD-10-CM | POA: Diagnosis not present

## 2022-03-28 DIAGNOSIS — Z131 Encounter for screening for diabetes mellitus: Secondary | ICD-10-CM | POA: Diagnosis not present

## 2022-03-28 DIAGNOSIS — E782 Mixed hyperlipidemia: Secondary | ICD-10-CM | POA: Diagnosis not present

## 2022-04-05 DIAGNOSIS — E039 Hypothyroidism, unspecified: Secondary | ICD-10-CM | POA: Diagnosis not present

## 2022-04-05 DIAGNOSIS — I25118 Atherosclerotic heart disease of native coronary artery with other forms of angina pectoris: Secondary | ICD-10-CM | POA: Diagnosis not present

## 2022-04-05 DIAGNOSIS — I10 Essential (primary) hypertension: Secondary | ICD-10-CM | POA: Diagnosis not present

## 2022-04-05 DIAGNOSIS — Z1331 Encounter for screening for depression: Secondary | ICD-10-CM | POA: Diagnosis not present

## 2022-04-05 DIAGNOSIS — E782 Mixed hyperlipidemia: Secondary | ICD-10-CM | POA: Diagnosis not present

## 2022-04-05 DIAGNOSIS — Z681 Body mass index (BMI) 19 or less, adult: Secondary | ICD-10-CM | POA: Diagnosis not present

## 2022-04-05 DIAGNOSIS — I739 Peripheral vascular disease, unspecified: Secondary | ICD-10-CM | POA: Diagnosis not present

## 2022-04-10 DIAGNOSIS — L608 Other nail disorders: Secondary | ICD-10-CM | POA: Diagnosis not present

## 2022-04-10 DIAGNOSIS — L84 Corns and callosities: Secondary | ICD-10-CM | POA: Diagnosis not present

## 2022-04-10 DIAGNOSIS — L603 Nail dystrophy: Secondary | ICD-10-CM | POA: Diagnosis not present

## 2022-04-10 DIAGNOSIS — L602 Onychogryphosis: Secondary | ICD-10-CM | POA: Diagnosis not present

## 2022-04-10 DIAGNOSIS — G8929 Other chronic pain: Secondary | ICD-10-CM | POA: Diagnosis not present

## 2022-04-10 DIAGNOSIS — M79674 Pain in right toe(s): Secondary | ICD-10-CM | POA: Diagnosis not present

## 2022-04-12 ENCOUNTER — Other Ambulatory Visit: Payer: Self-pay | Admitting: Internal Medicine

## 2022-06-26 ENCOUNTER — Other Ambulatory Visit: Payer: Self-pay | Admitting: Cardiovascular Disease

## 2022-08-05 ENCOUNTER — Other Ambulatory Visit: Payer: Self-pay | Admitting: Cardiovascular Disease

## 2022-10-29 ENCOUNTER — Other Ambulatory Visit: Payer: Self-pay | Admitting: Cardiovascular Disease

## 2023-01-30 ENCOUNTER — Other Ambulatory Visit: Payer: Self-pay | Admitting: Cardiovascular Disease
